# Patient Record
Sex: Female | Born: 1960 | Race: Black or African American | Hispanic: No | Marital: Married | Smoking: Current every day smoker
Health system: Southern US, Community
[De-identification: ages and names within clinical notes are randomized; demographics above are authoritative.]

---

## 2002-03-07 ENCOUNTER — Emergency Department (HOSPITAL_COMMUNITY): Admission: EM | Admit: 2002-03-07 | Discharge: 2002-03-07 | Payer: Self-pay | Admitting: Emergency Medicine

## 2003-04-02 ENCOUNTER — Emergency Department (HOSPITAL_COMMUNITY): Admission: AD | Admit: 2003-04-02 | Discharge: 2003-04-02 | Payer: Self-pay | Admitting: Emergency Medicine

## 2003-12-01 ENCOUNTER — Emergency Department (HOSPITAL_COMMUNITY): Admission: EM | Admit: 2003-12-01 | Discharge: 2003-12-01 | Payer: Self-pay | Admitting: *Deleted

## 2003-12-06 ENCOUNTER — Emergency Department (HOSPITAL_COMMUNITY): Admission: EM | Admit: 2003-12-06 | Discharge: 2003-12-06 | Payer: Self-pay

## 2009-04-13 ENCOUNTER — Emergency Department (HOSPITAL_COMMUNITY): Admission: EM | Admit: 2009-04-13 | Discharge: 2009-04-14 | Payer: Self-pay | Admitting: Emergency Medicine

## 2009-10-01 ENCOUNTER — Emergency Department (HOSPITAL_COMMUNITY): Admission: EM | Admit: 2009-10-01 | Discharge: 2009-10-01 | Payer: Self-pay | Admitting: Family Medicine

## 2010-05-01 ENCOUNTER — Emergency Department (HOSPITAL_COMMUNITY): Admission: EM | Admit: 2010-05-01 | Discharge: 2010-05-01 | Payer: Self-pay | Admitting: Emergency Medicine

## 2011-10-03 ENCOUNTER — Emergency Department (HOSPITAL_COMMUNITY)
Admission: EM | Admit: 2011-10-03 | Discharge: 2011-10-03 | Disposition: A | Discharge status: Home / Self Care | Payer: Self-pay | Attending: Emergency Medicine | Admitting: Emergency Medicine

## 2011-10-03 ENCOUNTER — Emergency Department (HOSPITAL_COMMUNITY): Payer: Self-pay

## 2011-10-03 ENCOUNTER — Encounter (HOSPITAL_COMMUNITY): Payer: Self-pay | Admitting: Nurse Practitioner

## 2011-10-03 DIAGNOSIS — S0083XA Contusion of other part of head, initial encounter: Secondary | ICD-10-CM

## 2011-10-03 DIAGNOSIS — K047 Periapical abscess without sinus: Secondary | ICD-10-CM

## 2011-10-03 DIAGNOSIS — S0510XA Contusion of eyeball and orbital tissues, unspecified eye, initial encounter: Secondary | ICD-10-CM | POA: Insufficient documentation

## 2011-10-03 DIAGNOSIS — K089 Disorder of teeth and supporting structures, unspecified: Secondary | ICD-10-CM | POA: Insufficient documentation

## 2011-10-03 DIAGNOSIS — K044 Acute apical periodontitis of pulpal origin: Secondary | ICD-10-CM | POA: Insufficient documentation

## 2011-10-03 DIAGNOSIS — S1093XA Contusion of unspecified part of neck, initial encounter: Secondary | ICD-10-CM | POA: Insufficient documentation

## 2011-10-03 DIAGNOSIS — S0003XA Contusion of scalp, initial encounter: Secondary | ICD-10-CM | POA: Insufficient documentation

## 2011-10-03 DIAGNOSIS — W010XXA Fall on same level from slipping, tripping and stumbling without subsequent striking against object, initial encounter: Secondary | ICD-10-CM | POA: Insufficient documentation

## 2011-10-03 MED ORDER — OXYCODONE-ACETAMINOPHEN 5-325 MG PO TABS: 2.0000 | ORAL_TABLET | ORAL | Status: AC | PRN

## 2011-10-03 MED ORDER — PENICILLIN V POTASSIUM 500 MG PO TABS: 500.0000 mg | ORAL_TABLET | Freq: Four times a day (QID) | ORAL | Status: AC

## 2011-10-03 MED ORDER — OXYCODONE-ACETAMINOPHEN 5-325 MG PO TABS
1.0000 | ORAL_TABLET | Freq: Once | ORAL | Status: AC
  Administered 2011-10-03: 1 via ORAL
  Filled 2011-10-03: qty 1

## 2011-10-03 NOTE — ED Provider Notes (Signed)
History     CSN: [id number]  Arrival date & time 10/03/11  1148   First MD Initiated Contact with Patient 10/03/11 1335      Chief Complaint  Patient presents with  . Fall    (Consider location/radiation/quality/duration/timing/severity/associated sxs/prior treatment) Patient is a 51 y.o. female presenting with fall. The history is provided by the patient.  Fall   Patient here with facial pain the following 4 days ago. Patient tripped and fell onto her mid face. Some upper lip swelling. No trouble with mastication. Patient use over-the-counter pain medication without relief. Denies any visual changes or double vision. No severe headaches or vomiting. Some pain to her teeth noted. She does have a history of dental caries History reviewed. No pertinent past medical history.  History reviewed. No pertinent past surgical history.  History reviewed. No pertinent family history.  History  Substance Use Topics  . Smoking status: Current Everyday Smoker -- 0.0 packs/day  . Smokeless tobacco: Not on file  . Alcohol Use: Yes     occasional     OB History    Grav Para Term Preterm Abortions TAB SAB Ect Mult Living                  Review of Systems  All other systems reviewed and are negative.    Allergies  Review of patient's allergies indicates no known allergies.  Home Medications   Current Outpatient Rx  Name Route Sig Dispense Refill  . IBUPROFEN 200 MG PO TABS Oral Take 400 mg by mouth every 6 (six) hours as needed. For pain      BP 153/97  Pulse 106  Temp(Src) 100.9 F (38.3 C) (Oral)  Resp 18  Ht 5\' 3"  (1.6 m)  Wt 120 lb (54.432 kg)  BMI 21.26 kg/m2  SpO2 98%  Physical Exam  Nursing note and vitals reviewed. Constitutional: She is oriented to person, place, and time. She appears well-developed and well-nourished.  Non-toxic appearance. No distress.  HENT:  Head: Normocephalic and atraumatic.       Bilateral. Per- Orbital ecchymosis noted without  crepitus or erythema. Upper lip edema noted. Poor dentition noted intraorally without signs of abscess drainage. Patient with normal mastication.  Eyes: Conjunctivae and EOM are normal. Pupils are equal, round, and reactive to light. Right eye exhibits no chemosis. Left conjunctiva has no hemorrhage.       No hyphema  Neck: Normal range of motion. Neck supple. No tracheal deviation present. No mass present.  Cardiovascular: Normal rate, regular rhythm and normal heart sounds.  Exam reveals no gallop.   No murmur heard. Pulmonary/Chest: Effort normal and breath sounds normal. No stridor. No respiratory distress. She has no decreased breath sounds. She has no wheezes. She has no rhonchi. She has no rales.  Abdominal: Soft. Normal appearance and bowel sounds are normal. She exhibits no distension. There is no tenderness. There is no rebound and no CVA tenderness.  Musculoskeletal: Normal range of motion. She exhibits no edema and no tenderness.  Neurological: She is alert and oriented to person, place, and time. She has normal strength. No cranial nerve deficit or sensory deficit. GCS eye subscore is 4. GCS verbal subscore is 5. GCS motor subscore is 6.  Skin: Skin is warm and dry. No abrasion and no rash noted.  Psychiatric: She has a normal mood and affect. Her speech is normal and behavior is normal.    ED Course  Procedures (including critical care time)  Labs  Reviewed - No data to display Ct Maxillofacial Wo Cm  10/03/2011  *RADIOLOGY REPORT*  Clinical Data: 51 year old female status post fall.  Soft tissue swelling and pain.  CT MAXILLOFACIAL WITHOUT CONTRAST  Technique:  Multidetector CT imaging of the maxillofacial structures was performed. Multiplanar CT image reconstructions were also generated.  Comparison: None.  Findings: Negative visualized noncontrast brain parenchyma. Disconjugate gaze, orbit soft tissues otherwise are within normal limits.  Visualized deep soft tissue spaces of the  face and neck are within normal limits.  Peri oral soft tissue swelling and stranding.  Severe peri apical lucency at the right mandibular bicuspid (series 5 image 18).  The mandible otherwise is intact.  There is also poor left maxillary posterior dentition.  No definite nasal bone fracture.  Maxilla and zygomatic arches are intact.  No acute facial fracture identified. No soft tissue fluid collection identified.  Visualized paranasal sinuses and mastoids are clear.  IMPRESSION: 1.  Perioral soft tissue swelling and stranding could be post traumatic or infectious. 2.  There is no acute facial fracture identified, but there is peri apical lucency at the right mandible residual bicuspid which could be an odontogenic source of  facial infection.  Original Report Authenticated By: Harley Hallmark, M.D.     No diagnosis found.    MDM  Patient be treated with antibiotics for dental infection.        Toy Baker, MD 10/03/11 1346

## 2011-10-03 NOTE — ED Notes (Signed)
States tripped in her room and hit her head on Sunday, woke with facial swelling. Reports facial swelling and pain remains since incident. Bilateral bruising under eyes and swelling to mouth. Reports vision is normal. A&Ox4.

## 2011-10-03 NOTE — Discharge Instructions (Signed)
Contusion A contusion is a deep bruise. Contusions are the result of an injury that caused bleeding under the skin. The contusion may turn blue, purple, or yellow. Minor injuries will give you a painless contusion, but more severe contusions may stay painful and swollen for a few weeks.  CAUSES  A contusion is usually caused by a blow, trauma, or direct force to an area of the body. SYMPTOMS   Swelling and redness of the injured area.   Bruising of the injured area.   Tenderness and soreness of the injured area.   Pain.  DIAGNOSIS  The diagnosis can be made by taking a history and physical exam. An X-ray, CT scan, or MRI may be needed to determine if there were any associated injuries, such as fractures. TREATMENT  Specific treatment will depend on what area of the body was injured. In general, the best treatment for a contusion is resting, icing, elevating, and applying cold compresses to the injured area. Over-the-counter medicines may also be recommended for pain control. Ask your caregiver what the best treatment is for your contusion. HOME CARE INSTRUCTIONS   Put ice on the injured area.   Put ice in a plastic bag.   Place a towel between your skin and the bag.   Leave the ice on for 15 to 20 minutes, 3 to 4 times a day.   Only take over-the-counter or prescription medicines for pain, discomfort, or fever as directed by your caregiver. Your caregiver may recommend avoiding anti-inflammatory medicines (aspirin, ibuprofen, and naproxen) for 48 hours because these medicines may increase bruising.   Rest the injured area.   If possible, elevate the injured area to reduce swelling.  SEEK IMMEDIATE MEDICAL CARE IF:   You have increased bruising or swelling.   You have pain that is getting worse.   Your swelling or pain is not relieved with medicines.  MAKE SURE YOU:   Understand these instructions.   Will watch your condition.   Will get help right away if you are not  doing well or get worse.  Document Released: 04/24/2005 Document Revised: 07/04/2011 Document Reviewed: 05/20/2011 [facility] Patient Information 2012 [place], [place].Abscessed Tooth A tooth abscess is a collection of infected fluid (pus) from a bacterial infection in the inner part of the tooth (pulp). It usually occurs at the end of the tooth's root.  CAUSES   A very bad cavity (extensive tooth decay).   Trauma to the tooth, such as a broken or chipped tooth, that allows bacteria to enter into the pulp.  SYMPTOMS  Severe pain in and around the infected tooth.   Swelling and redness around the abscessed tooth or in the mouth or face.   Tenderness.   Pus drainage.   Bad breath.   Bitter taste in the mouth.   Difficulty swallowing.   Difficulty opening the mouth.   Feeling sick to your stomach (nauseous).   Vomiting.   Chills.   Swollen neck glands.  DIAGNOSIS  A medical and dental history will be taken.   An examination will be performed by tapping on the abscessed tooth.   X-rays may be taken of the tooth to identify the abscess.  TREATMENT The goal of treatment is to eliminate the infection.   You may be prescribed antibiotic medicine to stop the infection from spreading.   A root canal may be performed to save the tooth. If the tooth cannot be saved, it may be pulled (extracted) and the abscess may be  drained.  HOME CARE INSTRUCTIONS  Only take over-the-counter or prescription medicines for pain, fever, or discomfort as directed by your caregiver.   Do not drive after taking pain medicine (narcotics).   Rinse your mouth (gargle) often with salt water ( tsp salt in 8 oz of warm water) to relieve pain or swelling.   Do not apply heat to the outside of your face.   Return to your dentist for further treatment as directed.  SEEK IMMEDIATE DENTAL CARE IF:  You have a temperature by mouth above 102 F (38.9 C), not controlled by medicine.   You have chills  or a very bad headache.   You have problems breathing or swallowing.   Your have trouble opening your mouth.   You develop swelling in the neck or around the eye.   Your pain is not helped by medicine.   Your pain is getting worse instead of better.  Document Released: 07/15/2005 Document Revised: 07/04/2011 Document Reviewed: 10/23/2010 [facility] Patient Information 2012 [place], [place].

## 2013-08-30 IMAGING — CT CT MAXILLOFACIAL W/O CM
3 of 4 series · 16 of 47 positions shown, 19 images · non-contrast
Comparison: None.

CLINICAL DATA: 50-year-old female status post fall.  Soft tissue
swelling and pain.

CT MAXILLOFACIAL WITHOUT CONTRAST
TECHNIQUE: Multidetector CT imaging of the maxillofacial
structures was performed. Multiplanar CT image reconstructions were
also generated.

[Series 3: orbit/facial 2.0 h30s · axial · 0.35mm/px · z∈[-166,-26]mm · 10 of 82 slices shown, 13 images]
[im 6/82  brain]
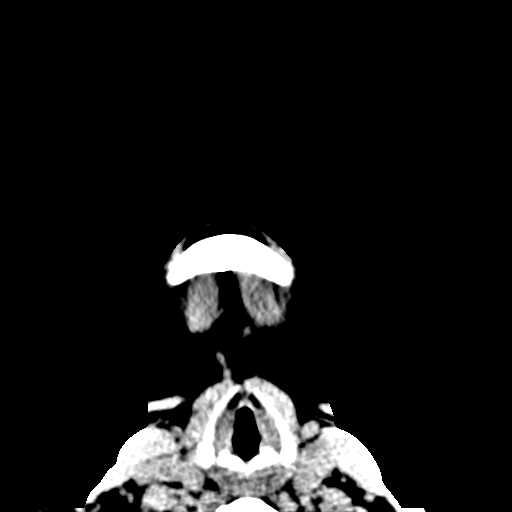
[im 6/82  bone]
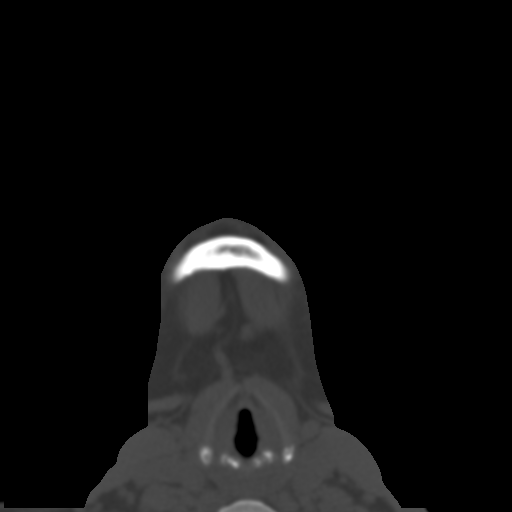
[im 14/82  bone]
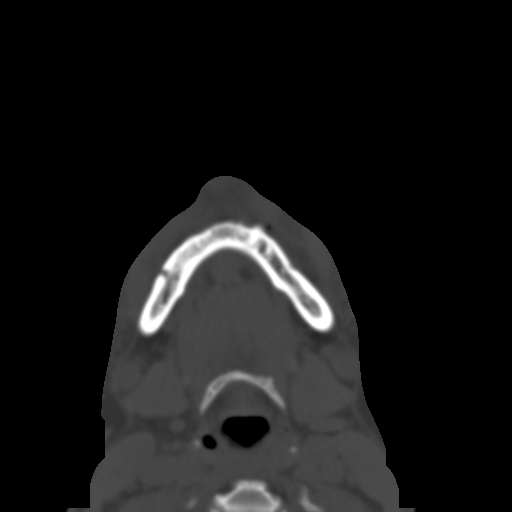
[im 23/82  bone]
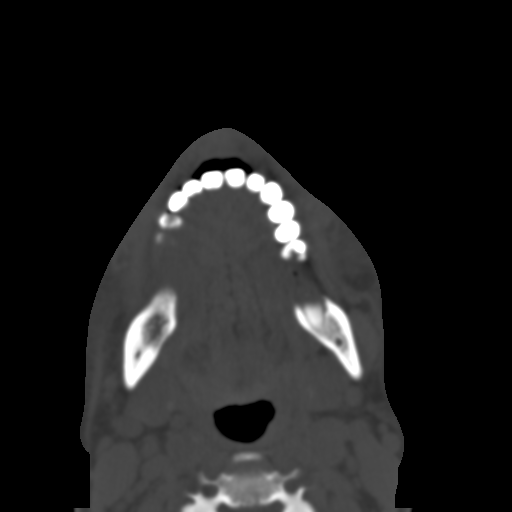
[im 28/82  bone]
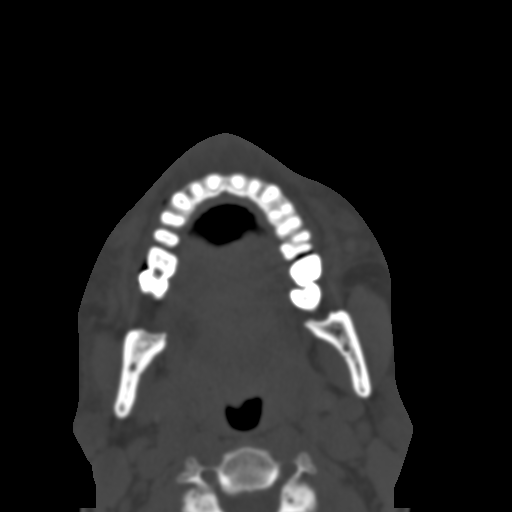
[im 37/82  brain]
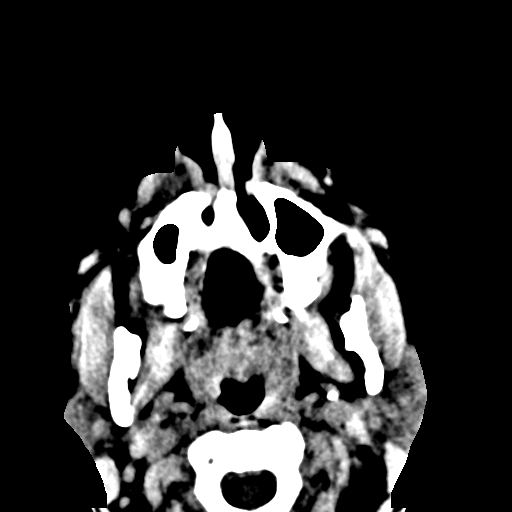
[im 37/82  bone]
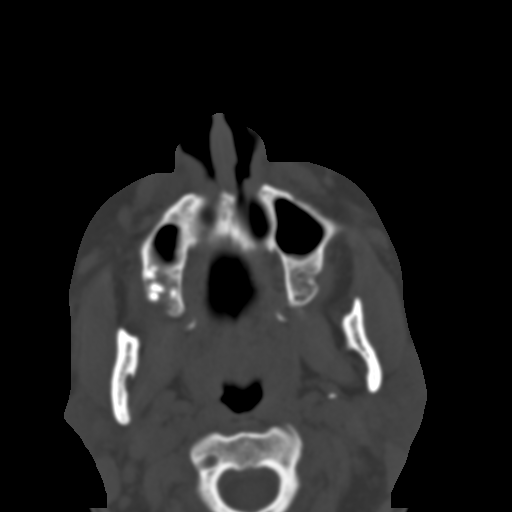
[im 45/82  bone]
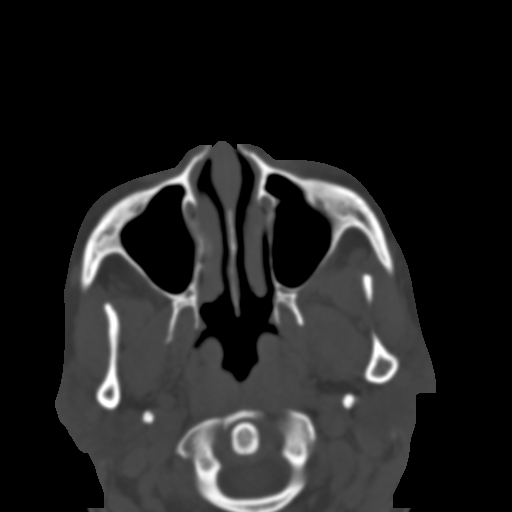
[im 54/82  bone]
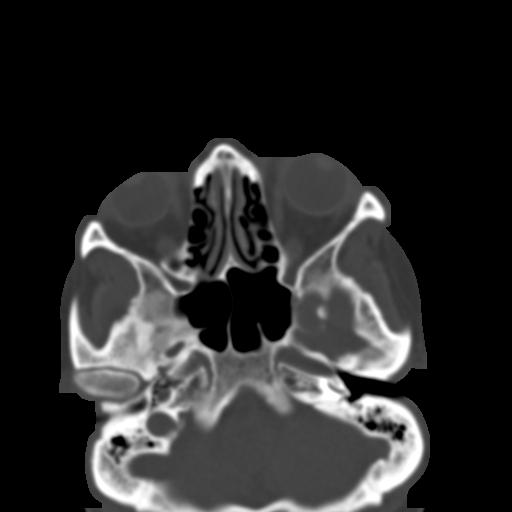
[im 62/82  bone]
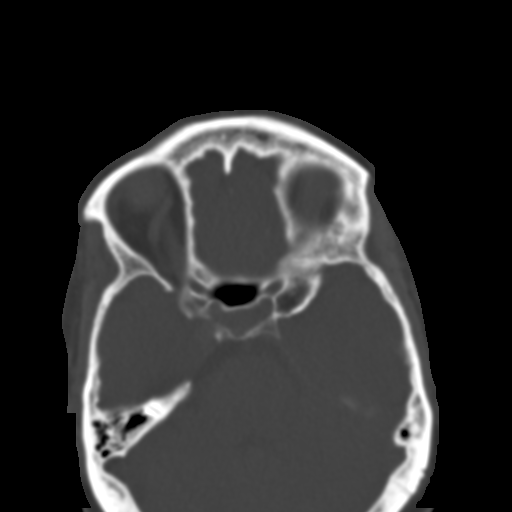
[im 68/82  brain]
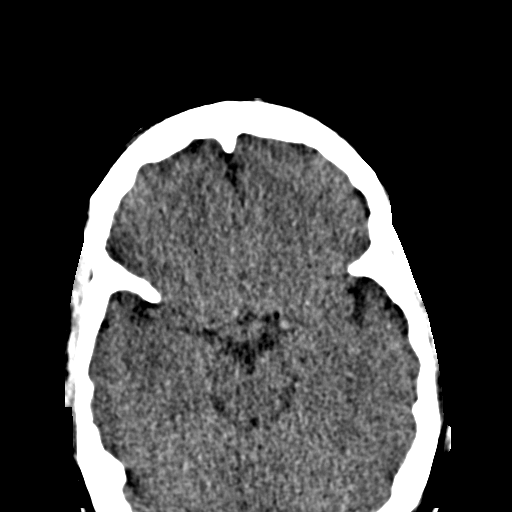
[im 68/82  bone]
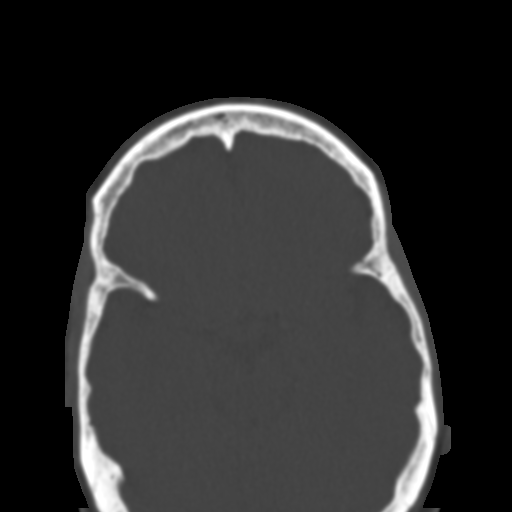
[im 76/82  bone]
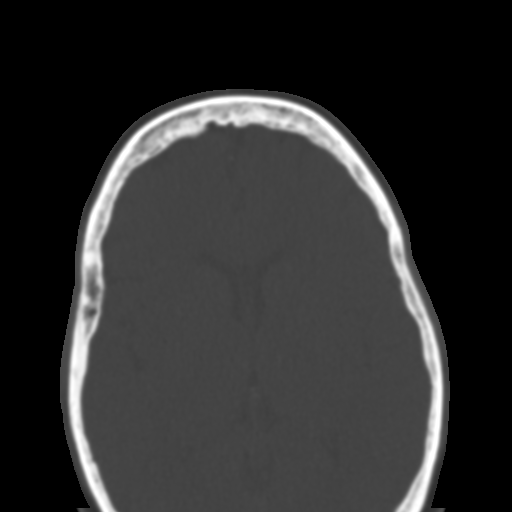

[Series 7: coronals · coronal · 0.32mm/px · 3 of 74 slices shown]
[im 19/74  bone]
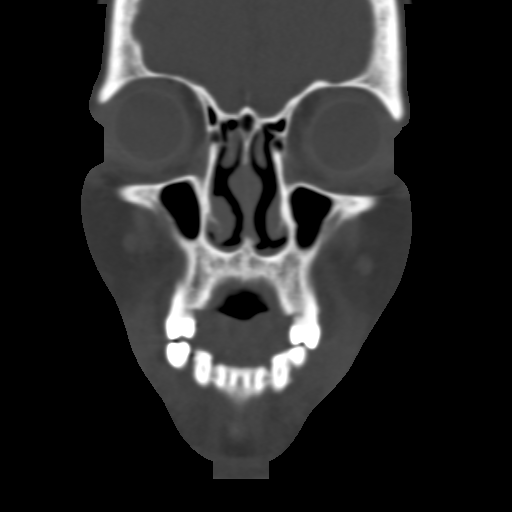
[im 37/74  bone]
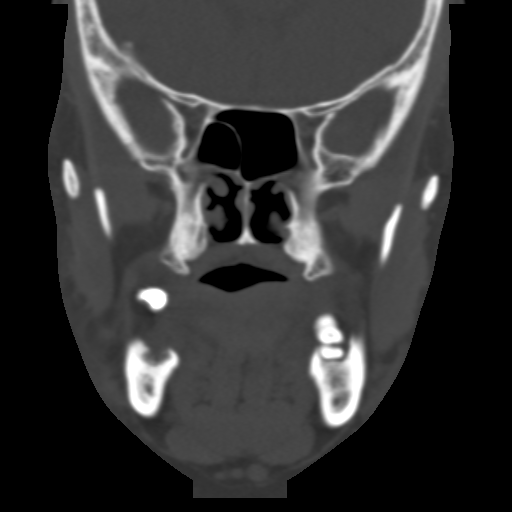
[im 55/74  bone]
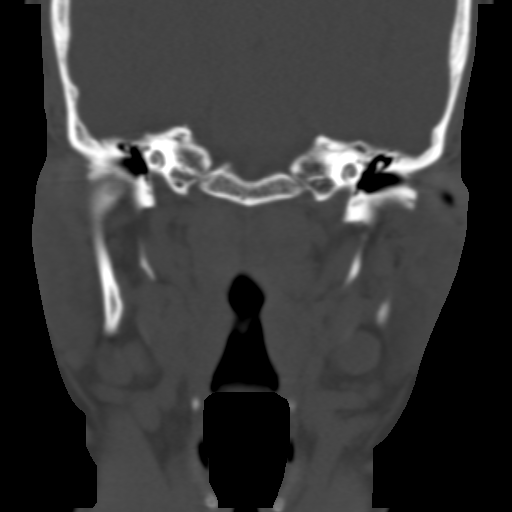

[Series 8: sagittals · sagittal · 0.32mm/px · 3 of 76 slices shown]
[im 26/76  bone]
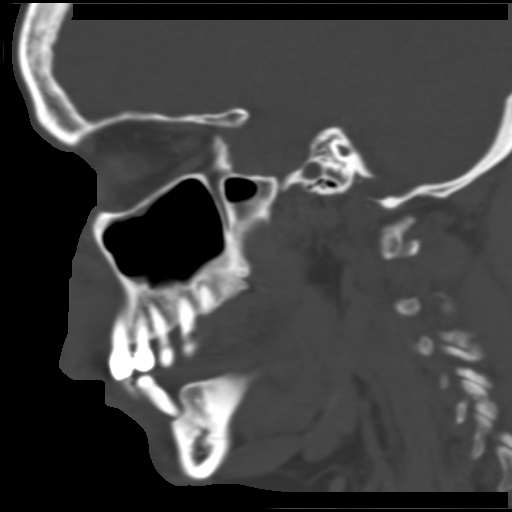
[im 38/76  bone]
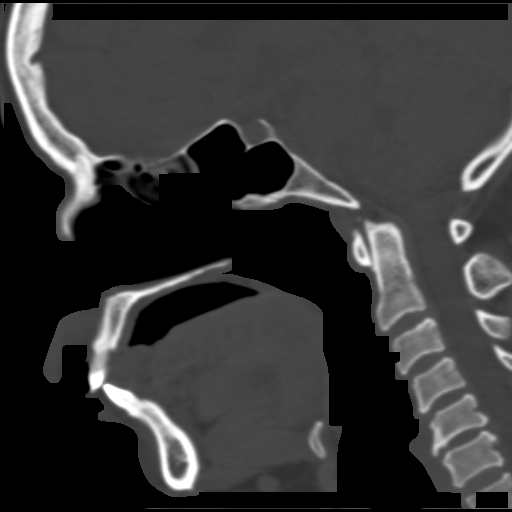
[im 51/76  bone]
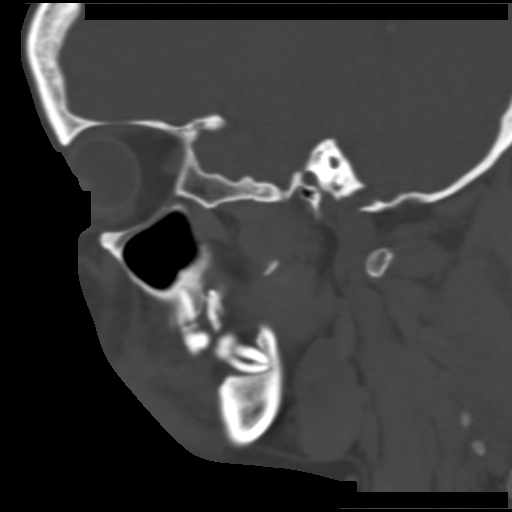

[16 of 47 positions shown; findings below may reference images not displayed]

FINDINGS: Negative visualized noncontrast brain parenchyma.
Disconjugate gaze, orbit soft tissues otherwise are within normal
limits.  Visualized deep soft tissue spaces of the face and neck
are within normal limits.

Peri oral soft tissue swelling and stranding.  Severe peri apical
lucency at the right mandibular bicuspid (series 5 image 18).  The
mandible otherwise is intact.  There is also poor left maxillary
posterior dentition.  No definite nasal bone fracture.  Maxilla and
zygomatic arches are intact.  No acute facial fracture identified.
No soft tissue fluid collection identified.

Visualized paranasal sinuses and mastoids are clear.
IMPRESSION: 1.  Perioral soft tissue swelling and stranding could be post
traumatic or infectious.
2.  There is no acute facial fracture identified, but there is peri
apical lucency at the right mandible residual bicuspid which could
be an odontogenic source of  facial infection.

## 2017-03-06 ENCOUNTER — Emergency Department (HOSPITAL_COMMUNITY)
Admission: EM | Admit: 2017-03-06 | Discharge: 2017-03-06 | Disposition: A | Discharge status: Home / Self Care | Payer: Self-pay | Attending: Emergency Medicine | Admitting: Emergency Medicine

## 2017-03-06 ENCOUNTER — Encounter (HOSPITAL_COMMUNITY): Payer: Self-pay | Admitting: Emergency Medicine

## 2017-03-06 DIAGNOSIS — L02414 Cutaneous abscess of left upper limb: Secondary | ICD-10-CM | POA: Insufficient documentation

## 2017-03-06 DIAGNOSIS — Y939 Activity, unspecified: Secondary | ICD-10-CM | POA: Insufficient documentation

## 2017-03-06 DIAGNOSIS — L03114 Cellulitis of left upper limb: Secondary | ICD-10-CM

## 2017-03-06 DIAGNOSIS — F17200 Nicotine dependence, unspecified, uncomplicated: Secondary | ICD-10-CM | POA: Insufficient documentation

## 2017-03-06 DIAGNOSIS — Y929 Unspecified place or not applicable: Secondary | ICD-10-CM | POA: Insufficient documentation

## 2017-03-06 DIAGNOSIS — W57XXXA Bitten or stung by nonvenomous insect and other nonvenomous arthropods, initial encounter: Secondary | ICD-10-CM | POA: Insufficient documentation

## 2017-03-06 DIAGNOSIS — Y998 Other external cause status: Secondary | ICD-10-CM | POA: Insufficient documentation

## 2017-03-06 MED ORDER — CEPHALEXIN 250 MG PO CAPS
500.0000 mg | ORAL_CAPSULE | Freq: Once | ORAL | Status: AC
  Administered 2017-03-06: 500 mg via ORAL
  Filled 2017-03-06: qty 2

## 2017-03-06 MED ORDER — LIDOCAINE-EPINEPHRINE (PF) 2 %-1:200000 IJ SOLN
10.0000 mL | Freq: Once | INTRAMUSCULAR | Status: AC
  Administered 2017-03-06: 10 mL via INTRADERMAL
  Filled 2017-03-06: qty 20

## 2017-03-06 MED ORDER — SULFAMETHOXAZOLE-TRIMETHOPRIM 800-160 MG PO TABS
1.0000 | ORAL_TABLET | Freq: Once | ORAL | Status: AC
  Administered 2017-03-06: 1 via ORAL
  Filled 2017-03-06: qty 1

## 2017-03-06 MED ORDER — CEPHALEXIN 500 MG PO CAPS: 500.0000 mg | ORAL_CAPSULE | Freq: Two times a day (BID) | ORAL | 0 refills | Status: DC

## 2017-03-06 MED ORDER — SULFAMETHOXAZOLE-TRIMETHOPRIM 800-160 MG PO TABS: 1.0000 | ORAL_TABLET | Freq: Two times a day (BID) | ORAL | 0 refills | Status: AC

## 2017-03-06 NOTE — ED Provider Notes (Signed)
[facility] Provider Note   CSN: [id number] Arrival date & time: 03/06/17  0907     History   Chief Complaint Chief Complaint  Patient presents with  . Insect Bite    HPI Virginia Cochran is a 56 y.o. female.  HPI Virginia Cochran is a 56 y.o. female presents to emergency department complaining of left forearm wound. She states she believes she may have been bit by an insect possibly spider. She reports seeing a bump to the left forearm 2 days ago. Since then it has become more red, swollen. States it is very tender. It is not draining. She denies any fever or chills. Denies any injuries. She states that yesterday she accidentally brushed against a lady's purse and it began to bleed. She has been applying alcohol and peroxide to the wound. She denies any history of the same.  History reviewed. No pertinent past medical history.  There are no active problems to display for this patient.   No past surgical history on file.  OB History    No data available       Home Medications    Prior to Admission medications   Medication Sig Start Date End Date Taking? Authorizing Provider  ibuprofen (ADVIL,MOTRIN) 200 MG tablet Take 400 mg by mouth every 6 (six) hours as needed. For pain    [provider]    Family History No family history on file.  Social History Social History  Substance Use Topics  . Smoking status: Current Every Day Smoker    Packs/day: 0.01  . Smokeless tobacco: Not on file  . Alcohol use Yes     Comment: occasional      Allergies   Patient has no known allergies.   Review of Systems Review of Systems  Constitutional: Negative for chills and fever.  Respiratory: Negative for cough, chest tightness and shortness of breath.   Cardiovascular: Negative for chest pain, palpitations and leg swelling.  Gastrointestinal: Negative for abdominal pain, diarrhea, nausea and vomiting.  Genitourinary: Negative for dysuria, flank pain and pelvic  pain.  Musculoskeletal: Negative for arthralgias, myalgias, neck pain and neck stiffness.  Skin: Positive for color change and wound. Negative for rash.  Neurological: Negative for dizziness, weakness and headaches.  All other systems reviewed and are negative.    Physical Exam Updated Vital Signs BP (!) 128/96   Pulse 68   Temp 97.8 F (36.6 C) (Oral)   Resp 18   Ht 5\' 3"  (1.6 m)   Wt 68.9 kg (152 lb)   SpO2 100%   BMI 26.93 kg/m   Physical Exam  Constitutional: She appears well-developed and well-nourished. No distress.  Eyes: Conjunctivae are normal.  Neck: Neck supple.  Musculoskeletal:  Raised, erythematous, indurated area to the left posterior forearm with surrounding cellulitis. The wound measures 3 cm across. Erythema measuring 5 cm across. No drainage.  Neurological: She is alert.  Skin: Skin is warm and dry.  Nursing note and vitals reviewed.    ED Treatments / Results  Labs (all labs ordered are listed, but only abnormal results are displayed) Labs Reviewed - No data to display  EKG  EKG Interpretation None       Radiology No results found.  Procedures Procedures (including critical care time)  INCISION AND DRAINAGE Performed by: Jaynie Crumble A Consent: Verbal consent obtained. Risks and benefits: risks, benefits and alternatives were discussed Type: abscess  Body area: left forearm  Anesthesia: local infiltration  Incision was made with  a scalpel.  Local anesthetic: lidocaine 2% w epinephrine  Anesthetic total: 2 ml  Complexity: complex Blunt dissection to break up loculations  Drainage: purulent  Drainage amount: moderate  Packing material: none  Patient tolerance: Patient tolerated the procedure well with no immediate complications.    Medications Ordered in ED Medications  lidocaine-EPINEPHrine (XYLOCAINE W/EPI) 2 %-1:200000 (PF) injection 10 mL (not administered)     Initial Impression / Assessment and Plan /  ED Course  I have reviewed the triage vital signs and the nursing notes.  Pertinent labs & imaging results that were available during my care of the patient were reviewed by me and considered in my medical decision making (see chart for details).     Pt with abscess and cellulitis to left forearm. Incised and drained in ED. Pt afebrile, otherwise non toxic appearing. Not immunocompromised. Will start on keflex and bactrim. Wound care at home. Follow up as needed. Return precautions discussed.   Vitals:   03/06/17 0937 03/06/17 1129  BP: 131/89 (!) 128/96  Pulse: 89 68  Resp: 16 18  Temp: 97.8 F (36.6 C)   TempSrc: Oral   SpO2: 97% 100%  Weight: 68.9 kg (152 lb)   Height: 5\' 3"  (1.6 m)      Final Clinical Impressions(s) / ED Diagnoses   Final diagnoses:  Abscess of left forearm  Cellulitis of left forearm    New Prescriptions New Prescriptions   CEPHALEXIN (KEFLEX) 500 MG CAPSULE    Take 1 capsule (500 mg total) by mouth 2 (two) times daily.   SULFAMETHOXAZOLE-TRIMETHOPRIM (BACTRIM DS,SEPTRA DS) 800-160 MG TABLET    Take 1 tablet by mouth 2 (two) times daily.     Jaynie CrumbleKirichenko, Kazi Reppond, PA-C 03/06/17 1654    Vanetta MuldersZackowski, Scott, MD 03/07/17 [phone]

## 2017-03-06 NOTE — Discharge Instructions (Signed)
Warm compresses several times a day. Keep wound clean and covered. Bactrim and keflex as prescribed until all gone. Follow up with family doctor as needed for recheck. Return if worsening.

## 2017-03-06 NOTE — ED Triage Notes (Signed)
Pt states bug bite to left forearm that has gotten infectied. Drainage present to side with redness to skin around site.

## 2017-07-20 ENCOUNTER — Ambulatory Visit (HOSPITAL_COMMUNITY)
Admission: EM | Admit: 2017-07-20 | Discharge: 2017-07-20 | Disposition: A | Discharge status: Home / Self Care | Payer: Self-pay | Attending: Family Medicine | Admitting: Family Medicine

## 2017-07-20 ENCOUNTER — Other Ambulatory Visit: Payer: Self-pay

## 2017-07-20 ENCOUNTER — Encounter (HOSPITAL_COMMUNITY): Payer: Self-pay | Admitting: Emergency Medicine

## 2017-07-20 DIAGNOSIS — H60502 Unspecified acute noninfective otitis externa, left ear: Secondary | ICD-10-CM

## 2017-07-20 MED ORDER — NEOMYCIN-POLYMYXIN-HC 3.5-10000-1 OT SUSP
3.0000 [drp] | Freq: Once | OTIC | Status: AC
  Administered 2017-07-20: 3 [drp] via OTIC

## 2017-07-20 MED ORDER — CIPROFLOXACIN-DEXAMETHASONE 0.3-0.1 % OT SUSP: 4.0000 [drp] | Freq: Two times a day (BID) | OTIC | 0 refills | Status: AC

## 2017-07-20 MED ORDER — NEOMYCIN-POLYMYXIN-HC 3.5-10000-1 OT SUSP
OTIC | Status: AC
  Filled 2017-07-20: qty 10

## 2017-07-20 NOTE — Discharge Instructions (Signed)
We are treating you for an ear infection of the outer canal.   Please use drops twice daily in left ear.   Please return if your ear pain persists with treatment, worsens, develop fever, facial paralysis.

## 2017-07-20 NOTE — ED Provider Notes (Signed)
[facility]    CSN: [id number] Arrival date & time: 07/20/17  1353     History   Chief Complaint Chief Complaint  Patient presents with  . Otalgia    HPI Virginia Cochran is a 56 y.o. female presenting with left ear pain for 3 days. She cleaned her ear with a Q tip and since she has felt like her ear is stopped up, pain, and decreased hearing. No drainage, no congestion, no cough. No fevers, headache. No chest pain, shortness of breath.   HPI  History reviewed. No pertinent past medical history.  There are no active problems to display for this patient.   History reviewed. No pertinent surgical history.  OB History    No data available       Home Medications    Prior to Admission medications   Medication Sig Start Date End Date Taking? Authorizing Provider  ciprofloxacin-dexamethasone (CIPRODEX) OTIC suspension Place 4 drops into the left ear 2 (two) times daily for 10 days. 07/20/17 07/30/17  Wieters, Junius CreamerHallie C, PA-C    Family History No family history on file.  Social History Social History   Tobacco Use  . Smoking status: Current Every Day Smoker    Packs/day: 0.01  Substance Use Topics  . Alcohol use: Yes    Comment: occasional   . Drug use: No     Allergies   Patient has no known allergies.   Review of Systems Review of Systems  All other systems reviewed and are negative.    Physical Exam Triage Vital Signs ED Triage Vitals  Enc Vitals Group     BP 07/20/17 1434 (!) 161/100     Pulse Rate 07/20/17 1434 89     Resp 07/20/17 1434 16     Temp 07/20/17 1434 99.3 F (37.4 C)     Temp Source 07/20/17 1434 Oral     SpO2 07/20/17 1434 100 %     Weight --      Height --      Head Circumference --      Peak Flow --      Pain Score 07/20/17 1436 7     Pain Loc --      Pain Edu? --      Excl. in GC? --    No data found.  Updated Vital Signs BP (!) 161/100   Pulse 89   Temp 99.3 F (37.4 C) (Oral)   Resp 16   SpO2 100%    Visual Acuity Right Eye Distance:   Left Eye Distance:   Bilateral Distance:    Right Eye Near:   Left Eye Near:    Bilateral Near:     Physical Exam  Constitutional: She appears well-developed and well-nourished. No distress.  HENT:  Head: Normocephalic and atraumatic.  Right Ear: Ear canal normal.  Left Ear: There is swelling and tenderness. No mastoid tenderness.  Left ear: tender to palpation of tragus, moderate swelling to tragus and lateral 1/3 of canal. Inner canal less edematous. TM without erythema, minimally visualized.   Right TM: with white appearing TM.   Eyes: Conjunctivae are normal.  Neck: Neck supple.  Cardiovascular: Normal rate and regular rhythm.  No murmur heard. Pulmonary/Chest: Effort normal and breath sounds normal. No respiratory distress.  Abdominal: Soft. There is no tenderness.  Musculoskeletal: She exhibits no edema.  Neurological: She is alert.  Skin: Skin is warm and dry.  Psychiatric: She has a normal mood and affect.  Nursing  note and vitals reviewed.    UC Treatments / Results  Labs (all labs ordered are listed, but only abnormal results are displayed) Labs Reviewed - No data to display  EKG  EKG Interpretation None       Radiology No results found.  Procedures Procedures (including critical care time)  Medications Ordered in UC Medications  neomycin-polymyxin-hydrocortisone (CORTISPORIN) OTIC (EAR) suspension 3 drop (3 drops Left EAR Given by Other 07/20/17 1522)     Initial Impression / Assessment and Plan / UC Course  I have reviewed the triage vital signs and the nursing notes.  Pertinent labs & imaging results that were available during my care of the patient were reviewed by me and considered in my medical decision making (see chart for details).     Patient symptoms suggest Acute Otitis Externa. Not Diabetic, no evidence of temporal bone involvement/mastoiditis. Patient treated with cortisporin drops in clinic  as she stated she would be unable to pick drops up until she was paid tomorrow. 4 drops instilled in clinic.  Discussed return precautions to include failure of symptoms to resolve with treatment, facial paralysis, increased pain.    Advised Tylenol/Ibuprofen for pain.  Final Clinical Impressions(s) / UC Diagnoses   Final diagnoses:  Acute otitis externa of left ear, unspecified type    ED Discharge Orders        Ordered    ciprofloxacin-dexamethasone (CIPRODEX) OTIC suspension  2 times daily     07/20/17 1508       Controlled Substance Prescriptions [place] Controlled Substance Registry consulted? Not Applicable   Lew DawesWieters, Hallie C, [place]PA-C 07/20/17 1731

## 2017-07-20 NOTE — ED Triage Notes (Signed)
Pt c/o L ear pain x 3 days 

## 2020-09-08 ENCOUNTER — Other Ambulatory Visit: Payer: Self-pay

## 2020-09-08 ENCOUNTER — Encounter (HOSPITAL_COMMUNITY): Payer: Self-pay | Admitting: Emergency Medicine

## 2020-09-08 ENCOUNTER — Ambulatory Visit (HOSPITAL_COMMUNITY)
Admission: EM | Admit: 2020-09-08 | Discharge: 2020-09-08 | Disposition: A | Discharge status: Home / Self Care | Payer: Self-pay

## 2020-09-08 DIAGNOSIS — R030 Elevated blood-pressure reading, without diagnosis of hypertension: Secondary | ICD-10-CM

## 2020-09-08 DIAGNOSIS — M79674 Pain in right toe(s): Secondary | ICD-10-CM

## 2020-09-08 NOTE — ED Notes (Signed)
Pt BP was elevated while in triage checked on both arms. Pt denies any chest pain, double vision, SOB, or back pain.

## 2020-09-08 NOTE — Discharge Instructions (Addendum)
Take Tylenol or ibuprofen as needed for discomfort.  Your blood pressure is elevated today at 171/106.  Please have this rechecked by your primary care provider in 1-2 weeks.

## 2020-09-08 NOTE — ED Triage Notes (Signed)
Pt states that she stomped her left toe Monday on the edge of her toe. Pt states that her toe is swollen, bruised, and she feels like its jammed. Pt states that she needs a note for work.

## 2020-09-08 NOTE — ED Provider Notes (Signed)
[facility]    CSN: [id number] Arrival date & time: 09/08/20  1215      History   Chief Complaint Chief Complaint  Patient presents with  . Toe Pain    HPI Virginia Cochran is a 60 y.o. female.   Patient presents with pain in her right great toe after hitting it on a door 5 days ago.  She reports pain, redness, swelling.  The pain is worse with movement and ambulation.  She denies fever, open wounds, numbness, weakness, paresthesias, or other symptoms.  No treatment attempted at home.  She denies pertinent medical history.   The history is provided by the patient.    History reviewed. No pertinent past medical history.  There are no problems to display for this patient.   History reviewed. No pertinent surgical history.  OB History   No obstetric history on file.      Home Medications    Prior to Admission medications   Not on File    Family History History reviewed. No pertinent family history.  Social History Social History   Tobacco Use  . Smoking status: Current Every Day Smoker    Packs/day: 0.01  . Smokeless tobacco: Never Used  Substance Use Topics  . Alcohol use: Yes    Comment: occasional   . Drug use: No     Allergies   Patient has no known allergies.   Review of Systems Review of Systems  Constitutional: Negative for chills and fever.  HENT: Negative for ear pain and sore throat.   Eyes: Negative for pain and visual disturbance.  Respiratory: Negative for cough and shortness of breath.   Cardiovascular: Negative for chest pain and palpitations.  Gastrointestinal: Negative for abdominal pain and vomiting.  Genitourinary: Negative for dysuria and hematuria.  Musculoskeletal: Positive for arthralgias. Negative for back pain.  Skin: Negative for rash and wound.  Neurological: Negative for syncope, weakness and numbness.  All other systems reviewed and are negative.    Physical Exam Triage Vital Signs ED Triage Vitals   Enc Vitals Group     BP 09/08/20 1253 (!) 171/106     Pulse Rate 09/08/20 1253 70     Resp 09/08/20 1253 18     Temp 09/08/20 1253 98 F (36.7 C)     Temp Source 09/08/20 1253 Oral     SpO2 09/08/20 1253 99 %     Weight --      Height --      Head Circumference --      Peak Flow --      Pain Score 09/08/20 1251 6     Pain Loc --      Pain Edu? --      Excl. in GC? --    No data found.  Updated Vital Signs BP (!) 171/106 (BP Location: Right Arm) Comment: 174/107 rechecked on the left arm  Pulse 70   Temp 98 F (36.7 C) (Oral)   Resp 18   SpO2 99%   Visual Acuity Right Eye Distance:   Left Eye Distance:   Bilateral Distance:    Right Eye Near:   Left Eye Near:    Bilateral Near:     Physical Exam Vitals and nursing note reviewed.  Constitutional:      General: She is not in acute distress.    Appearance: She is well-developed and well-nourished. She is not ill-appearing.  HENT:     Head: Normocephalic and atraumatic.  Mouth/Throat:     Mouth: Mucous membranes are moist.  Eyes:     Conjunctiva/sclera: Conjunctivae normal.  Cardiovascular:     Rate and Rhythm: Normal rate and regular rhythm.     Heart sounds: Normal heart sounds.  Pulmonary:     Effort: Pulmonary effort is normal. No respiratory distress.     Breath sounds: Normal breath sounds.  Abdominal:     Palpations: Abdomen is soft.     Tenderness: There is no abdominal tenderness.  Musculoskeletal:        General: Tenderness present. No swelling, deformity or edema.     Cervical back: Neck supple.       Feet:  Skin:    General: Skin is warm and dry.     Capillary Refill: Capillary refill takes less than 2 seconds.     Findings: No bruising, erythema, lesion or rash.  Neurological:     General: No focal deficit present.     Mental Status: She is alert and oriented to person, place, and time.     Sensory: No sensory deficit.     Motor: No weakness.     Gait: Gait normal.  Psychiatric:         Mood and Affect: Mood and affect and mood normal.        Behavior: Behavior normal.      UC Treatments / Results  Labs (all labs ordered are listed, but only abnormal results are displayed) Labs Reviewed - No data to display  EKG   Radiology No results found.  Procedures Procedures (including critical care time)  Medications Ordered in UC Medications - No data to display  Initial Impression / Assessment and Plan / UC Course  I have reviewed the triage vital signs and the nursing notes.  Pertinent labs & imaging results that were available during my care of the patient were reviewed by me and considered in my medical decision making (see chart for details).   Right great toe pain, elevated blood pressure reading.  Patient declined to wait for an x-ray today.  Instructed her to take Tylenol or ibuprofen as needed for discomfort.  Discussed that her blood pressure is elevated today needs to be rechecked by her PCP in 1 to 2 weeks.  She agrees to plan of care.   Final Clinical Impressions(s) / UC Diagnoses   Final diagnoses:  Pain of great toe, right  Elevated blood pressure reading     Discharge Instructions     Take Tylenol or ibuprofen as needed for discomfort.  Your blood pressure is elevated today at 171/106.  Please have this rechecked by your primary care provider in 1-2 weeks.         ED Prescriptions    None     PDMP not reviewed this encounter.   Mickie Bail, NP 09/08/20 1409

## 2021-08-17 ENCOUNTER — Other Ambulatory Visit: Payer: Self-pay

## 2021-08-17 ENCOUNTER — Ambulatory Visit (HOSPITAL_COMMUNITY)
Admission: EM | Admit: 2021-08-17 | Discharge: 2021-08-17 | Disposition: A | Discharge status: Home / Self Care | Payer: 59 | Attending: Internal Medicine | Admitting: Internal Medicine

## 2021-08-17 ENCOUNTER — Encounter (HOSPITAL_COMMUNITY): Payer: Self-pay | Admitting: Emergency Medicine

## 2021-08-17 DIAGNOSIS — M5410 Radiculopathy, site unspecified: Secondary | ICD-10-CM | POA: Diagnosis not present

## 2021-08-17 MED ORDER — GABAPENTIN 100 MG PO CAPS: 100.0000 mg | ORAL_CAPSULE | Freq: Three times a day (TID) | ORAL | 0 refills | Status: AC | PRN

## 2021-08-17 NOTE — Discharge Instructions (Addendum)
As we discussed, I feel like your symptoms may be related to disc disease in your neck.  I am reassured by her exam that there is no evidence of stroke.  Take medication as prescribed.  This medication may make you sleepy.  Someone from our office will be in contact to help you establish primary care.  You need to follow-up on this and your high blood pressure.

## 2021-08-17 NOTE — ED Triage Notes (Signed)
Pt c/o left arm numbness and tingling for a month.

## 2021-08-17 NOTE — ED Provider Notes (Signed)
[facility]    CSN: [id number] Arrival date & time: 08/17/21  1616      History   Chief Complaint Chief Complaint  Patient presents with   Numbness    Left arm     HPI Israel Wunder is a 61 y.o. female presents urgent care today with complaints of left arm numbness and tingling persistent x34-month.  Patient's states symptoms keeping her up at night with burning, tingling.  She denies any recent injury or trauma, no suspicious rashes or lesions, no recent fever or chills, no weakness.    History reviewed. No pertinent past medical history.  There are no problems to display for this patient.   History reviewed. No pertinent surgical history.  OB History   No obstetric history on file.      Home Medications    Prior to Admission medications   Medication Sig Start Date End Date Taking? Authorizing Provider  gabapentin (NEURONTIN) 100 MG capsule Take 1 capsule (100 mg total) by mouth 3 (three) times daily as needed. 08/17/21  Yes Rolla Etienne, NP    Family History No family history on file.  Social History Social History   Tobacco Use   Smoking status: Every Day    Packs/day: 0.01    Types: Cigarettes   Smokeless tobacco: Never  Substance Use Topics   Alcohol use: Yes    Comment: occasional    Drug use: No     Allergies   Patient has no known allergies.   Review of Systems As stated in HPI otherwise negative   Physical Exam Triage Vital Signs ED Triage Vitals  Enc Vitals Group     BP      Pulse      Resp      Temp      Temp src      SpO2      Weight      Height      Head Circumference      Peak Flow      Pain Score      Pain Loc      Pain Edu?      Excl. in GC?    No data found.  Updated Vital Signs BP (!) 164/115 (BP Location: Left Arm)    Pulse 75    Temp 98.5 F (36.9 C) (Oral)    Resp 18    SpO2 96%   Visual Acuity Right Eye Distance:   Left Eye Distance:   Bilateral Distance:    Right Eye Near:   Left Eye  Near:    Bilateral Near:     Physical Exam Constitutional:      General: She is not in acute distress.    Appearance: Normal appearance. She is not ill-appearing or toxic-appearing.  Musculoskeletal:        General: No swelling, tenderness or deformity. Normal range of motion.     Cervical back: Normal range of motion and neck supple. No rigidity.     Comments: -Spurlings test, -Phalens.  Strength 5/5 bilateral upper extremity, no decreased range of motion  Neurological:     General: No focal deficit present.     Mental Status: She is alert and oriented to person, place, and time.     Motor: No weakness.  Psychiatric:        Mood and Affect: Mood normal.        Behavior: Behavior normal.     UC Treatments / Results  Labs (all labs ordered are listed, but only abnormal results are displayed) Labs Reviewed - No data to display  EKG   Radiology No results found.  Procedures Procedures (including critical care time)  Medications Ordered in UC Medications - No data to display  Initial Impression / Assessment and Plan / UC Course  I have reviewed the triage vital signs and the nursing notes.  Pertinent labs & imaging results that were available during my care of the patient were reviewed by me and considered in my medical decision making (see chart for details).  Paraesthesias, LUE -Symptoms persistent x2 months.  No significant findings on exam -Findings suggestive of cervical radiculopathy -Gabapentin 100 mg 3 times daily as needed -PCP assistance for follow-up regarding this issue along with uncontrolled blood pressure.  Chart review shows hx of same -Follow-up for any worsening symptoms  Reviewed expections re: course of current medical issues. Questions answered. Outlined signs and symptoms indicating need for more acute intervention. Pt verbalized understanding. AVS given  Final Clinical Impressions(s) / UC Diagnoses   Final diagnoses:  Radiculopathy,  unspecified spinal region     Discharge Instructions      As we discussed, I feel like your symptoms may be related to disc disease in your neck.  I am reassured by her exam that there is no evidence of stroke.  Take medication as prescribed.  This medication may make you sleepy.  Someone from our office will be in contact to help you establish primary care.  You need to follow-up on this and your high blood pressure.     ED Prescriptions     Medication Sig Dispense Auth. Provider   gabapentin (NEURONTIN) 100 MG capsule Take 1 capsule (100 mg total) by mouth 3 (three) times daily as needed. 30 capsule Rolla Etienne, NP      PDMP not reviewed this encounter.   Rolla Etienne, NP 08/17/21 1702

## 2021-08-29 ENCOUNTER — Ambulatory Visit: Payer: 59 | Admitting: Family

## 2021-09-03 ENCOUNTER — Ambulatory Visit (HOSPITAL_COMMUNITY)
Admission: EM | Admit: 2021-09-03 | Discharge: 2021-09-03 | Disposition: A | Discharge status: Home / Self Care | Payer: 59 | Attending: Physician Assistant | Admitting: Physician Assistant

## 2021-09-03 ENCOUNTER — Encounter (HOSPITAL_COMMUNITY): Payer: Self-pay | Admitting: Physician Assistant

## 2021-09-03 ENCOUNTER — Other Ambulatory Visit: Payer: Self-pay

## 2021-09-03 DIAGNOSIS — R21 Rash and other nonspecific skin eruption: Secondary | ICD-10-CM | POA: Diagnosis not present

## 2021-09-03 LAB — CBC WITH DIFFERENTIAL/PLATELET
Abs Immature Granulocytes: 0 10*3/uL (ref 0.00–0.07)
Basophils Absolute: 0.1 10*3/uL (ref 0.0–0.1)
Basophils Relative: 1 %
Eosinophils Absolute: 0.2 10*3/uL (ref 0.0–0.5)
Eosinophils Relative: 3 %
HCT: 44.9 % (ref 36.0–46.0)
Hemoglobin: 14.4 g/dL (ref 12.0–15.0)
Immature Granulocytes: 0 %
Lymphocytes Relative: 38 %
Lymphs Abs: 2.2 10*3/uL (ref 0.7–4.0)
MCH: 27.6 pg (ref 26.0–34.0)
MCHC: 32.1 g/dL (ref 30.0–36.0)
MCV: 86.2 fL (ref 80.0–100.0)
Monocytes Absolute: 0.6 10*3/uL (ref 0.1–1.0)
Monocytes Relative: 10 %
Neutro Abs: 2.7 10*3/uL (ref 1.7–7.7)
Neutrophils Relative %: 48 %
Platelets: 228 10*3/uL (ref 150–400)
RBC: 5.21 MIL/uL — ABNORMAL HIGH (ref 3.87–5.11)
RDW: 13.9 % (ref 11.5–15.5)
WBC: 5.7 10*3/uL (ref 4.0–10.5)
nRBC: 0 % (ref 0.0–0.2)

## 2021-09-03 LAB — COMPREHENSIVE METABOLIC PANEL
ALT: 24 U/L (ref 0–44)
AST: 24 U/L (ref 15–41)
Albumin: 3.7 g/dL (ref 3.5–5.0)
Alkaline Phosphatase: 59 U/L (ref 38–126)
Anion gap: 8 (ref 5–15)
BUN: 9 mg/dL (ref 6–20)
CO2: 28 mmol/L (ref 22–32)
Calcium: 9.1 mg/dL (ref 8.9–10.3)
Chloride: 106 mmol/L (ref 98–111)
Creatinine, Ser: 0.91 mg/dL (ref 0.44–1.00)
GFR, Estimated: >60 mL/min (ref >60)
Glucose, Bld: 109 mg/dL — ABNORMAL HIGH (ref 70–99)
Potassium: 4.2 mmol/L (ref 3.5–5.1)
Sodium: 142 mmol/L (ref 135–145)
Total Bilirubin: 0.7 mg/dL (ref 0.3–1.2)
Total Protein: 7.6 g/dL (ref 6.5–8.1)

## 2021-09-03 MED ORDER — PREDNISONE 20 MG PO TABS: 40.0000 mg | ORAL_TABLET | Freq: Every day | ORAL | 0 refills | Status: AC

## 2021-09-03 NOTE — ED Provider Notes (Signed)
[facility]    CSN: [id number] Arrival date & time: 09/03/21  1046      History   Chief Complaint Chief Complaint  Patient presents with   Rash    HPI Virginia Cochran is a 61 y.o. female.   Patient here today for evaluation of rash that started about a week ago. Rash is present to hands only and is itchy. She has not tried any treatment for symptoms. She does not report fever. She does note that she has been moving and is not sure if she has come into contact with something during the move, but no other family members in the household have any similar rash.   The history is provided by the patient.   History reviewed. No pertinent past medical history.  There are no problems to display for this patient.   History reviewed. No pertinent surgical history.  OB History   No obstetric history on file.      Home Medications    Prior to Admission medications   Medication Sig Start Date End Date Taking? Authorizing Provider  predniSONE (DELTASONE) 20 MG tablet Take 2 tablets (40 mg total) by mouth daily with breakfast for 5 days. 09/03/21 09/08/21 Yes Tomi Bamberger, PA-C  gabapentin (NEURONTIN) 100 MG capsule Take 1 capsule (100 mg total) by mouth 3 (three) times daily as needed. 08/17/21   Rolla Etienne, NP    Family History History reviewed. No pertinent family history.  Social History Social History   Tobacco Use   Smoking status: Every Day    Packs/day: 0.01    Types: Cigarettes   Smokeless tobacco: Never  Substance Use Topics   Alcohol use: Yes    Comment: occasional    Drug use: No     Allergies   Patient has no known allergies.   Review of Systems Review of Systems  Constitutional:  Negative for chills and fever.  HENT:  Negative for drooling.   Eyes:  Negative for discharge and redness.  Respiratory:  Negative for shortness of breath and wheezing.   Gastrointestinal:  Negative for nausea and vomiting.  Skin:  Positive for rash.     Physical Exam Triage Vital Signs ED Triage Vitals  Enc Vitals Group     BP      Pulse      Resp      Temp      Temp src      SpO2      Weight      Height      Head Circumference      Peak Flow      Pain Score      Pain Loc      Pain Edu?      Excl. in GC?    No data found.  Updated Vital Signs BP (!) 199/110 (BP Location: Left Arm)    Pulse 69    Temp 98.2 F (36.8 C) (Oral)    Resp 16    SpO2 100%      Physical Exam Vitals and nursing note reviewed.  Constitutional:      General: She is not in acute distress.    Appearance: Normal appearance. She is not ill-appearing.  HENT:     Head: Normocephalic and atraumatic.     Nose: Nose normal.  Cardiovascular:     Rate and Rhythm: Normal rate.  Pulmonary:     Effort: Pulmonary effort is normal. No respiratory distress.  Skin:  General: Skin is warm and dry.     Findings: Rash (erythematous excoriated papular rash noted to bilateral palms diffusely) present.  Neurological:     Mental Status: She is alert.  Psychiatric:        Mood and Affect: Mood normal.        Thought Content: Thought content normal.     UC Treatments / Results  Labs (all labs ordered are listed, but only abnormal results are displayed) Labs Reviewed  CBC WITH DIFFERENTIAL/PLATELET  COMPREHENSIVE METABOLIC PANEL  RPR    EKG   Radiology No results found.  Procedures Procedures (including critical care time)  Medications Ordered in UC Medications - No data to display  Initial Impression / Assessment and Plan / UC Course  I have reviewed the triage vital signs and the nursing notes.  Pertinent labs & imaging results that were available during my care of the patient were reviewed by me and considered in my medical decision making (see chart for details).    Unknown etiology of rash but will trial steroid burst and labs ordered to rule out secondary syphillis. No oral lesions- hand foot and mouth unlikely. Low suspicion for  RMSF given winter months, no fever and no reported tick bite. Discussed with patient that her blood pressure was very elevated-- patient reports she recently obtained insurance and is looking to establish care with PCP. Strongly encouraged this and discussed that she could do so through MyChart. HTN treatment deferred today as she is not in any acute distress and concerns for adequate follow up and refills. I did encouraged follow up here in urgent care with any further concerns.   Final Clinical Impressions(s) / UC Diagnoses   Final diagnoses:  Rash     Discharge Instructions       Please make appointment to establish care with PCP.      ED Prescriptions     Medication Sig Dispense Auth. Provider   predniSONE (DELTASONE) 20 MG tablet Take 2 tablets (40 mg total) by mouth daily with breakfast for 5 days. 10 tablet Tomi Bamberger, PA-C      PDMP not reviewed this encounter.   Tomi Bamberger, PA-C 09/03/21 1214

## 2021-09-03 NOTE — ED Triage Notes (Signed)
Pt presents to the office with itchy rash on hands x 1 week.

## 2021-09-03 NOTE — Discharge Instructions (Signed)
°  Please make appointment to establish care with PCP.

## 2021-09-04 LAB — RPR: RPR Ser Ql: NONREACTIVE

## 2022-11-22 ENCOUNTER — Encounter (HOSPITAL_COMMUNITY): Payer: Self-pay | Admitting: *Deleted

## 2022-11-22 ENCOUNTER — Ambulatory Visit (HOSPITAL_COMMUNITY)
Admission: EM | Admit: 2022-11-22 | Discharge: 2022-11-22 | Disposition: A | Discharge status: Home / Self Care | Payer: Self-pay | Attending: Nurse Practitioner | Admitting: Nurse Practitioner

## 2022-11-22 DIAGNOSIS — B029 Zoster without complications: Secondary | ICD-10-CM

## 2022-11-22 MED ORDER — VALACYCLOVIR HCL 1 G PO TABS: 1000.0000 mg | ORAL_TABLET | Freq: Three times a day (TID) | ORAL | 0 refills | Status: AC

## 2022-11-22 NOTE — ED Provider Notes (Signed)
[facility]    CSN: [id number] Arrival date & time: 11/22/22  1345      History   Chief Complaint Chief Complaint  Patient presents with   Rash    HPI Virginia Cochran is a 62 y.o. female.   Patient presents today with 2-day history of itchy, red, and painful rash under her right breast that wraps around to her back.  She denies numbness or tingling in the rash, however reports is intensely itchy at nighttime.  She has been applying lotion which does not seem to help with the rash.  No known sick contacts.  No recent change in detergents, soaps, personal care products.  No shortness of breath or throat/tongue swelling.  No new muscle pain or joint aches.  No history of similar type of rash.  Has not had her shingles vaccine, does not have a PCP.    History reviewed. No pertinent past medical history.  There are no problems to display for this patient.   History reviewed. No pertinent surgical history.  OB History   No obstetric history on file.      Home Medications    Prior to Admission medications   Medication Sig Start Date End Date Taking? Authorizing Provider  valACYclovir (VALTREX) 1000 MG tablet Take 1 tablet (1,000 mg total) by mouth every 8 (eight) hours for 10 days. 11/22/22 12/02/22 Yes Valentino Nose, NP  gabapentin (NEURONTIN) 100 MG capsule Take 1 capsule (100 mg total) by mouth 3 (three) times daily as needed. 08/17/21   Rolla Etienne, NP    Family History History reviewed. No pertinent family history.  Social History Social History   Tobacco Use   Smoking status: Every Day    Packs/day: .01    Types: Cigarettes   Smokeless tobacco: Never  Vaping Use   Vaping Use: Never used  Substance Use Topics   Alcohol use: Yes    Comment: occasional    Drug use: No     Allergies   Patient has no known allergies.   Review of Systems Review of Systems Per HPI  Physical Exam Triage Vital Signs ED Triage Vitals  Enc Vitals Group      BP 11/22/22 1443 (!) 159/95     Pulse Rate 11/22/22 1443 72     Resp 11/22/22 1443 18     Temp 11/22/22 1443 98.2 F (36.8 C)     Temp Source 11/22/22 1443 Oral     SpO2 11/22/22 1443 96 %     Weight --      Height --      Head Circumference --      Peak Flow --      Pain Score 11/22/22 1442 8     Pain Loc --      Pain Edu? --      Excl. in GC? --    No data found.  Updated Vital Signs BP (!) 159/95 (BP Location: Left Arm)   Pulse 72   Temp 98.2 F (36.8 C) (Oral)   Resp 18   SpO2 96%   Visual Acuity Right Eye Distance:   Left Eye Distance:   Bilateral Distance:    Right Eye Near:   Left Eye Near:    Bilateral Near:     Physical Exam Vitals and nursing note reviewed.  Constitutional:      General: She is not in acute distress.    Appearance: Normal appearance. She is not toxic-appearing.  HENT:  Mouth/Throat:     Mouth: Mucous membranes are moist.     Pharynx: Oropharynx is clear.  Pulmonary:     Effort: Pulmonary effort is normal. No respiratory distress.  Skin:    General: Skin is warm and dry.     Capillary Refill: Capillary refill takes less than 2 seconds.     Findings: Erythema and rash present. Rash is vesicular.     Comments: Erythematous, hyperpigmented, vesicular rash to right breast that wraps around to back.  Rash does not cross the midline.  Neurological:     Mental Status: She is alert and oriented to person, place, and time.  Psychiatric:        Behavior: Behavior is cooperative.      UC Treatments / Results  Labs (all labs ordered are listed, but only abnormal results are displayed) Labs Reviewed - No data to display  EKG   Radiology No results found.  Procedures Procedures (including critical care time)  Medications Ordered in UC Medications - No data to display  Initial Impression / Assessment and Plan / UC Course  I have reviewed the triage vital signs and the nursing notes.  Pertinent labs & imaging results that  were available during my care of the patient were reviewed by me and considered in my medical decision making (see chart for details).   Patient is well-appearing, normotensive, afebrile, not tachycardic, not tachypneic, oxygenating well on room air.    1. Herpes zoster without complication Rashes consistent with shingles Start valacyclovir to prevent new lesions Supportive care discussed and handout given Recommended covering rash with nonadherent gauze when the blisters open to prevent spread ER return precautions discussed with patient  The patient was given the opportunity to ask questions.  All questions answered to their satisfaction.  The patient is in agreement to this plan.    Final Clinical Impressions(s) / UC Diagnoses   Final diagnoses:  Herpes zoster without complication     Discharge Instructions      You have shingles.  Please take the valacyclovir as prescribed to treat it.  You can use Benadryl at nighttime as needed for itching.  If the rash breaks open and starts oozing, please keep it covered with a nonadherent gauze to prevent spread.    ED Prescriptions     Medication Sig Dispense Auth. Provider   valACYclovir (VALTREX) 1000 MG tablet Take 1 tablet (1,000 mg total) by mouth every 8 (eight) hours for 10 days. 30 tablet Valentino Nose, NP      PDMP not reviewed this encounter.   Valentino Nose, NP 11/22/22 1525

## 2022-11-22 NOTE — Discharge Instructions (Addendum)
You have shingles.  Please take the valacyclovir as prescribed to treat it.  You can use Benadryl at nighttime as needed for itching.  If the rash breaks open and starts oozing, please keep it covered with a nonadherent gauze to prevent spread.

## 2022-11-22 NOTE — ED Triage Notes (Signed)
Pt states she has a rash on her right breast X 3 days, its red and painful. She is only putting lotion on it.
